# Patient Record
Sex: Male | Born: 1935 | Race: White | Hispanic: No | Smoking: Former smoker
Health system: Southern US, Community
[De-identification: ages and names within clinical notes are randomized; demographics above are authoritative.]

## PROBLEM LIST (undated history)

## (undated) DIAGNOSIS — N200 Calculus of kidney: Secondary | ICD-10-CM

## (undated) DIAGNOSIS — F1020 Alcohol dependence, uncomplicated: Secondary | ICD-10-CM

## (undated) HISTORY — PX: TONSILLECTOMY: SUR1361

## (undated) HISTORY — PX: BACK SURGERY: SHX140

## (undated) HISTORY — PX: KIDNEY STONE SURGERY: SHX686

---

## 2012-04-21 ENCOUNTER — Encounter (HOSPITAL_COMMUNITY): Payer: Self-pay

## 2012-04-21 ENCOUNTER — Emergency Department (HOSPITAL_COMMUNITY)
Admission: EM | Admit: 2012-04-21 | Discharge: 2012-04-21 | Disposition: A | Discharge status: Home / Self Care | Payer: Medicare Other | Attending: Emergency Medicine | Admitting: Emergency Medicine

## 2012-04-21 DIAGNOSIS — E119 Type 2 diabetes mellitus without complications: Secondary | ICD-10-CM | POA: Insufficient documentation

## 2012-04-21 DIAGNOSIS — F411 Generalized anxiety disorder: Secondary | ICD-10-CM | POA: Insufficient documentation

## 2012-04-21 DIAGNOSIS — R630 Anorexia: Secondary | ICD-10-CM | POA: Insufficient documentation

## 2012-04-21 DIAGNOSIS — Z87442 Personal history of urinary calculi: Secondary | ICD-10-CM | POA: Insufficient documentation

## 2012-04-21 DIAGNOSIS — F329 Major depressive disorder, single episode, unspecified: Secondary | ICD-10-CM | POA: Insufficient documentation

## 2012-04-21 DIAGNOSIS — R5381 Other malaise: Secondary | ICD-10-CM | POA: Insufficient documentation

## 2012-04-21 DIAGNOSIS — F17200 Nicotine dependence, unspecified, uncomplicated: Secondary | ICD-10-CM | POA: Insufficient documentation

## 2012-04-21 DIAGNOSIS — F3289 Other specified depressive episodes: Secondary | ICD-10-CM | POA: Insufficient documentation

## 2012-04-21 DIAGNOSIS — R5383 Other fatigue: Secondary | ICD-10-CM | POA: Insufficient documentation

## 2012-04-21 HISTORY — DX: Calculus of kidney: N20.0

## 2012-04-21 LAB — URINALYSIS, ROUTINE W REFLEX MICROSCOPIC
Leukocytes, UA: NEGATIVE
Nitrite: NEGATIVE
Protein, ur: 30 mg/dL — AB
Urobilinogen, UA: 0.2 mg/dL (ref 0.0–1.0)
pH: 5.5 (ref 5.0–8.0)

## 2012-04-21 LAB — COMPREHENSIVE METABOLIC PANEL
Albumin: 4.1 g/dL (ref 3.5–5.2)
BUN: 17 mg/dL (ref 6–23)
Chloride: 102 mEq/L (ref 96–112)
Creatinine, Ser: 0.85 mg/dL (ref 0.50–1.35)
GFR calc Af Amer: >90 mL/min (ref >90)
GFR calc non Af Amer: 78 mL/min — ABNORMAL LOW (ref >90)
Glucose, Bld: 136 mg/dL — ABNORMAL HIGH (ref 70–99)
Total Bilirubin: 0.4 mg/dL (ref 0.3–1.2)

## 2012-04-21 LAB — CBC WITH DIFFERENTIAL/PLATELET
Basophils Absolute: 0 10*3/uL (ref 0.0–0.1)
Eosinophils Relative: 3 % (ref 0–5)
HCT: 37.6 % — ABNORMAL LOW (ref 39.0–52.0)
Lymphocytes Relative: 37 % (ref 12–46)
MCHC: 33.5 g/dL (ref 30.0–36.0)
MCV: 84.1 fL (ref 78.0–100.0)
Monocytes Absolute: 0.3 10*3/uL (ref 0.1–1.0)
RDW: 13.7 % (ref 11.5–15.5)
WBC: 5.1 10*3/uL (ref 4.0–10.5)

## 2012-04-21 LAB — ETHANOL: Alcohol, Ethyl (B): <11 mg/dL (ref 0–11)

## 2012-04-21 MED ORDER — SODIUM CHLORIDE 0.9 % IV SOLN
INTRAVENOUS | Status: DC
  Administered 2012-04-21: 125 mL/h via INTRAVENOUS

## 2012-04-21 MED ORDER — ALPRAZOLAM 0.5 MG PO TABS: 0.5000 mg | ORAL_TABLET | Freq: Every evening | ORAL | Status: AC | PRN

## 2012-04-21 MED ORDER — BUPROPION HCL ER (XL) 150 MG PO TB24: 150.0000 mg | ORAL_TABLET | Freq: Every day | ORAL | Status: DC

## 2012-04-21 MED ORDER — SODIUM CHLORIDE 0.9 % IV BOLUS (SEPSIS)
500.0000 mL | Freq: Once | INTRAVENOUS | Status: AC
  Administered 2012-04-21: 500 mL via INTRAVENOUS

## 2012-04-21 MED ORDER — VITAMIN B-1 100 MG PO TABS
100.0000 mg | ORAL_TABLET | Freq: Once | ORAL | Status: AC
  Administered 2012-04-21: 100 mg via ORAL
  Filled 2012-04-21: qty 1

## 2012-04-21 NOTE — ED Provider Notes (Signed)
History     CSN: [id number]  Arrival date & time 04/21/12  1538   First MD Initiated Contact with Patient 04/21/12 1551      Chief Complaint  Patient presents with  . Weakness  . Anxiety    HPI The patient was brought in by his daughter for trouble with poor appetite, anxiety, restlessness and general weakness.  He has been having trouble with anxiety at night and poor sleep.  He will wake up with anxiety and concerns that he might be having a heart attack although he denies chest pain or shortness of breath.  These symptoms have been ongoing to some extent ever since his wife went into a nursing home home three years ago.  She recently passed away in 2023-08-25. Mr. Geiman admits he started drinking daily when his wife went into the nursing home.  Maybe 6 oz of whisky every day.  He will drink but does not have much appetite.  Since his wife went into the nursing home he has had to prepare his own foods.  He has been having forzen dinners but does not have much appetite for them anymore.  His daughter lives 15 miles away and she often tries to bring him food and meals.  His daughter convinced him to come in today to make sure there was nothing serious going on.  He denies SI, HI.   Past Medical History  Diagnosis Date  . Diabetes mellitus   . Kidney stones     Past Surgical History  Procedure Date  . Back surgery   . Kidney stone surgery   . Tonsillectomy     No family history on file.  History  Substance Use Topics  . Smoking status: Current Some Day Smoker    Types: Cigarettes, Pipe  . Smokeless tobacco: Not on file  . Alcohol Use: Yes     daily -whiskey      Review of Systems  Constitutional: Negative for fever.  Respiratory: Negative for wheezing.   Cardiovascular: Negative for chest pain.  Gastrointestinal: Negative for abdominal pain.  Skin: Negative for rash.  Neurological: Negative for tremors.  Psychiatric/Behavioral: Negative for confusion.  All other  systems reviewed and are negative.    Allergies  Review of patient's allergies indicates not on file.  Home Medications  No current outpatient prescriptions on file.  BP 147/81  Pulse 71  Temp 98.5 F (36.9 C) (Oral)  Resp 18  Ht 5\' 10"  (1.778 m)  Wt 190 lb (86.183 kg)  BMI 27.26 kg/m2  SpO2 92%  Physical Exam  Nursing note and vitals reviewed. Constitutional: He appears well-developed and well-nourished. No distress.  HENT:  Head: Normocephalic and atraumatic.  Right Ear: External ear normal.  Left Ear: External ear normal.  Eyes: Conjunctivae are normal. Right eye exhibits no discharge. Left eye exhibits no discharge. No scleral icterus.  Neck: Neck supple. No tracheal deviation present.  Cardiovascular: Normal rate, regular rhythm and intact distal pulses.   Pulmonary/Chest: Effort normal and breath sounds normal. No stridor. No respiratory distress. He has no wheezes. He has no rales.  Abdominal: Soft. Bowel sounds are normal. He exhibits no distension. There is no tenderness. There is no rebound and no guarding.  Musculoskeletal: He exhibits no edema and no tenderness.  Neurological: He is alert. He has normal strength. No sensory deficit. Cranial nerve deficit:  no gross defecits noted. He exhibits normal muscle tone. He displays no seizure activity. Coordination normal.  Skin: Skin is  warm and dry. No rash noted.  Psychiatric: He has a normal mood and affect.    ED Course  Procedures (including critical care time)  Rate: 62  Rhythm: normal sinus rhythm with first degree av block  QRS Axis: normal  Intervals: normal  ST/T Wave abnormalities: normal  Conduction Disutrbances:none  Old EKG Reviewed: none available  Labs Reviewed  CBC WITH DIFFERENTIAL - Abnormal; Notable for the following:    Hemoglobin 12.6 (*)     HCT 37.6 (*)     Platelets 138 (*)     All other components within normal limits  COMPREHENSIVE METABOLIC PANEL - Abnormal; Notable for the  following:    Glucose, Bld 136 (*)     GFR calc non Af Amer 78 (*)     All other components within normal limits  URINALYSIS, ROUTINE W REFLEX MICROSCOPIC - Abnormal; Notable for the following:    Specific Gravity, Urine >1.030 (*)     Bilirubin Urine SMALL (*)     Ketones, ur TRACE (*)     Protein, ur 30 (*)     All other components within normal limits  URINE MICROSCOPIC-ADD ON - Abnormal; Notable for the following:    Casts HYALINE CASTS (*)     All other components within normal limits  ETHANOL  URINE CULTURE   No results found.   1. Depression       MDM  I suspect the patient's symptoms are related to depression.  We discussed decreasing his alcohol consumption.  Pt had been on celexa in the past but he did not like the side effects.  Will try wellbutrin.  Will also given a short course of xanax to help with his sleeping.  Pt has family support here with his daughter.  [facility] resources provided.        Celene Kras, MD 04/21/12 [phone]

## 2012-04-21 NOTE — ED Notes (Signed)
Pt's wife passed away in oct. Has not been taking care of self well, recently (last 2 weeks) having lots of anxiety, restless, and weakness. "pacing a lot"

## 2012-04-21 NOTE — ED Notes (Signed)
Pt brought to er by daughter with concern that pt had not been taking care of himself lately, pt states that his wife passed away in 10-Aug-2011 after being in nursing home for several years, last few weeks pt has been experiencing nervousness, not sleeping well, pacing, not eating or drinking well. Pt does admit to drinking ETOH at times, Dr. Lynelle Doctor at bedside,

## 2012-04-24 LAB — URINE CULTURE
Colony Count: NO GROWTH
Culture: NO GROWTH

## 2015-01-28 ENCOUNTER — Emergency Department (HOSPITAL_COMMUNITY)
Admission: EM | Admit: 2015-01-28 | Discharge: 2015-01-28 | Disposition: A | Discharge status: Home / Self Care | Payer: Medicare Other | Attending: Emergency Medicine | Admitting: Emergency Medicine

## 2015-01-28 ENCOUNTER — Encounter (HOSPITAL_COMMUNITY): Payer: Self-pay

## 2015-01-28 DIAGNOSIS — L97929 Non-pressure chronic ulcer of unspecified part of left lower leg with unspecified severity: Secondary | ICD-10-CM

## 2015-01-28 DIAGNOSIS — I83029 Varicose veins of left lower extremity with ulcer of unspecified site: Secondary | ICD-10-CM

## 2015-01-28 DIAGNOSIS — E119 Type 2 diabetes mellitus without complications: Secondary | ICD-10-CM | POA: Diagnosis not present

## 2015-01-28 DIAGNOSIS — Z87442 Personal history of urinary calculi: Secondary | ICD-10-CM | POA: Diagnosis not present

## 2015-01-28 DIAGNOSIS — L97829 Non-pressure chronic ulcer of other part of left lower leg with unspecified severity: Secondary | ICD-10-CM | POA: Insufficient documentation

## 2015-01-28 DIAGNOSIS — Z79899 Other long term (current) drug therapy: Secondary | ICD-10-CM | POA: Diagnosis not present

## 2015-01-28 DIAGNOSIS — Z7982 Long term (current) use of aspirin: Secondary | ICD-10-CM | POA: Insufficient documentation

## 2015-01-28 DIAGNOSIS — Z87891 Personal history of nicotine dependence: Secondary | ICD-10-CM | POA: Diagnosis not present

## 2015-01-28 DIAGNOSIS — L97919 Non-pressure chronic ulcer of unspecified part of right lower leg with unspecified severity: Secondary | ICD-10-CM

## 2015-01-28 DIAGNOSIS — I83019 Varicose veins of right lower extremity with ulcer of unspecified site: Secondary | ICD-10-CM

## 2015-01-28 DIAGNOSIS — I83008 Varicose veins of unspecified lower extremity with ulcer other part of lower leg: Secondary | ICD-10-CM | POA: Diagnosis not present

## 2015-01-28 DIAGNOSIS — R2243 Localized swelling, mass and lump, lower limb, bilateral: Secondary | ICD-10-CM | POA: Diagnosis present

## 2015-01-28 MED ORDER — HYDROCODONE-ACETAMINOPHEN 5-325 MG PO TABS: 1.0000 | ORAL_TABLET | ORAL | Status: DC | PRN

## 2015-01-28 NOTE — ED Provider Notes (Signed)
CSN: [id number]     Arrival date & time 01/28/15  1537 History   First MD Initiated Contact with Patient 01/28/15 1614     Chief Complaint  Patient presents with  . Leg Swelling     (Consider location/radiation/quality/duration/timing/severity/associated sxs/prior Treatment) HPI.... Bilateral leg swelling and redness for several months. Patient's been treated by his primary care doctor with Lasix for the edema, antibiotics for cellulitis. There is now some weeping in the lower extremities. No chest pain, dyspnea, fever. Severity is moderate. Nothing makes symptoms better or worse.  Past Medical History  Diagnosis Date  . Diabetes mellitus   . Kidney stones    Past Surgical History  Procedure Laterality Date  . Back surgery    . Kidney stone surgery    . Tonsillectomy     No family history on file. History  Substance Use Topics  . Smoking status: Former Smoker    Types: Cigarettes, Pipe  . Smokeless tobacco: Not on file  . Alcohol Use: Yes     Comment: 8 shots of whiskey daily    Review of Systems  All other systems reviewed and are negative.     Allergies  Bee venom  Home Medications   Prior to Admission medications   Medication Sig Start Date End Date Taking? Authorizing Provider  aspirin EC 81 MG tablet Take 81 mg by mouth daily.   Yes Historical Provider, MD  fosinopril (MONOPRIL) 40 MG tablet Take 40 mg by mouth 2 (two) times daily.   Yes Historical Provider, MD  furosemide (LASIX) 40 MG tablet Take 40 mg by mouth daily. 01/14/15  Yes Historical Provider, MD  metFORMIN (GLUCOPHAGE) 500 MG tablet Take 500 mg by mouth 2 (two) times daily.   Yes Historical Provider, MD  ranitidine (ZANTAC) 150 MG tablet Take 150 mg by mouth 2 (two) times daily.   Yes Historical Provider, MD  simvastatin (ZOCOR) 20 MG tablet Take 10 mg by mouth at bedtime.    Yes Historical Provider, MD  terazosin (HYTRIN) 5 MG capsule Take 5 mg by mouth at bedtime.   Yes Historical Provider, MD   buPROPion (WELLBUTRIN XL) 150 MG 24 hr tablet Take 1 tablet (150 mg total) by mouth daily. Patient not taking: Reported on 01/28/2015 04/21/12 04/21/13  Linwood Dibbles, MD  diphenhydrAMINE (BENADRYL) 25 mg capsule Take 25 mg by mouth at bedtime as needed for sleep. sleep    Historical Provider, MD  hydrochlorothiazide (HYDRODIURIL) 25 MG tablet Take 25-50 mg by mouth daily. 12/30/14   Historical Provider, MD  HYDROcodone-acetaminophen (NORCO) 5-325 MG per tablet Take 1 tablet by mouth every 4 (four) hours as needed. 01/28/15   Donnetta Hutching, MD   BP 103/55 mmHg  Pulse 84  Temp(Src) 97.6 F (36.4 C) (Oral)  Resp 20  Ht  (1.778 m)  Wt 230 lb (104.327 kg)  BMI 33.00 kg/m2  SpO2 100% Physical Exam  Constitutional: He is oriented to person, place, and time. He appears well-developed and well-nourished.  HENT:  Head: Normocephalic and atraumatic.  Eyes: Conjunctivae and EOM are normal. Pupils are equal, round, and reactive to light.  Neck: Normal range of motion. Neck supple.  Cardiovascular: Normal rate and regular rhythm.   Pulmonary/Chest: Effort normal and breath sounds normal.  Abdominal: Soft. Bowel sounds are normal.  Musculoskeletal: Normal range of motion.  Bilateral posterior thighs and calf areas nontender  Neurological: He is alert and oriented to person, place, and time.  Skin:  Bilateral lower extremities  from mid tibia distally exhibit erythema and some weepiness. No frank cellulitis.  Psychiatric: He has a normal mood and affect. His behavior is normal.  Nursing note and vitals reviewed.   ED Course  Procedures (including critical care time) Labs Review Labs Reviewed - No data to display  Imaging Review No results found.   EKG Interpretation None      MDM   Final diagnoses:  Venous stasis ulcers of both lower extremities    I discussed the diagnosis of venous stasis ulcers with the patient and his daughter. He[place].  Rx Norco for pain in feet.    Donnetta HutchingBrian Lamarcus Spira, MD 01/28/15 [phone]

## 2015-01-28 NOTE — Discharge Instructions (Signed)
Stasis Ulcer  A stasis ulcer is a sore on the skin. It occurs in the legs when your blood flow is damaged.  HOME CARE  Do not stand or sit in one position for a long time. Do not sit with your legs crossed. Raise (elevate) your legs.  Wear elastic stockings (compression stockings). Do not wear tight clothing around the legs or waist area. Use and apply bandages (dressings) as told.  Walk as much as possible. If you take long car or plane rides, take a break to walk around every 2 hours.  Only take medicine as told by your doctor.  Raise the end of your bed with 2-inch blocks only if your doctor says it is okay.  If you cut the skin on your leg, lie down and raise your leg. Gently clean the area with a clean cloth. Then, put pressure on the cut until the bleeding stops. Put a bandage on.  Keep all doctor visits. GET HELP RIGHT AWAY IF:  The ulcer area starts to break down.  You have pain, redness, or tenderness in or around the ulcer.  You have yellowish-white fluid (pus) or hard puffiness (swelling) in or around the ulcer.  Your pain gets worse.  You get a fever.  You have chest pain or shortness of breath. MAKE SURE YOU:  Understand these instructions.  Will watch your condition.  Will get help right away if you are not doing well or get worse. Document Released: 11/03/2004 Document Revised: 01/21/2013 Document Reviewed: 01/25/2011 [facility] Patient Information 2015 [employer] [place]. This information is not intended to replace advice given to you by your health care provider. Make sure you discuss any questions you have with your health care provider.   You have a condition called  venous stasis ulcers. Recommend visit to wound care center. Prescription for pain medication.

## 2015-01-28 NOTE — ED Notes (Signed)
Daughter reports pt has been on lasix for the past 2 months because of swelling, redness, and weeping edema in both legs.  Reprots R worse than left.

## 2015-02-10 ENCOUNTER — Emergency Department (HOSPITAL_COMMUNITY): Payer: Medicare Other

## 2015-02-10 ENCOUNTER — Emergency Department (HOSPITAL_COMMUNITY)
Admission: EM | Admit: 2015-02-10 | Discharge: 2015-02-10 | Disposition: A | Discharge status: Home / Self Care | Payer: Medicare Other | Attending: Emergency Medicine | Admitting: Emergency Medicine

## 2015-02-10 ENCOUNTER — Encounter (HOSPITAL_COMMUNITY): Payer: Self-pay

## 2015-02-10 DIAGNOSIS — E119 Type 2 diabetes mellitus without complications: Secondary | ICD-10-CM | POA: Diagnosis not present

## 2015-02-10 DIAGNOSIS — Z87442 Personal history of urinary calculi: Secondary | ICD-10-CM | POA: Insufficient documentation

## 2015-02-10 DIAGNOSIS — Z79899 Other long term (current) drug therapy: Secondary | ICD-10-CM | POA: Insufficient documentation

## 2015-02-10 DIAGNOSIS — Z7982 Long term (current) use of aspirin: Secondary | ICD-10-CM | POA: Insufficient documentation

## 2015-02-10 DIAGNOSIS — M79642 Pain in left hand: Secondary | ICD-10-CM | POA: Diagnosis present

## 2015-02-10 DIAGNOSIS — L02512 Cutaneous abscess of left hand: Secondary | ICD-10-CM | POA: Insufficient documentation

## 2015-02-10 DIAGNOSIS — Z87891 Personal history of nicotine dependence: Secondary | ICD-10-CM | POA: Diagnosis not present

## 2015-02-10 MED ORDER — LIDOCAINE HCL (PF) 1 % IJ SOLN
5.0000 mL | Freq: Once | INTRAMUSCULAR | Status: AC
Start: 2015-02-10 — End: 2015-02-10
  Administered 2015-02-10: 5 mL
  Filled 2015-02-10: qty 5

## 2015-02-10 MED ORDER — DOXYCYCLINE HYCLATE 100 MG PO TABS
100.0000 mg | ORAL_TABLET | Freq: Once | ORAL | Status: AC
  Administered 2015-02-10: 100 mg via ORAL
  Filled 2015-02-10: qty 1

## 2015-02-10 MED ORDER — DOXYCYCLINE HYCLATE 100 MG PO TABS: 100.0000 mg | ORAL_TABLET | Freq: Two times a day (BID) | ORAL | Status: DC

## 2015-02-10 NOTE — ED Notes (Signed)
Pt reports pain in left index finger Saturday.  Noticed a bump, redness, and swelling x 3 days.  Denies injury.  [place] [facility] [place].

## 2015-02-10 NOTE — Discharge Instructions (Signed)

## 2015-02-18 NOTE — ED Provider Notes (Signed)
CSN: [id number]     Arrival date & time 02/10/15  1702 History   First MD Initiated Contact with Patient 02/10/15 1959     Chief Complaint  Patient presents with  . Hand Pain     (Consider location/radiation/quality/duration/timing/severity/associated sxs/prior Treatment) HPI   87yM with increasing pain and swelling in L index finger. Denies trauma. Onset 3 days ago. Progressively worsening. No fever or chills. No drainage. [facility] referred to ED over concern that "infection might be in the joint."  Past Medical History  Diagnosis Date  . Diabetes mellitus   . Kidney stones    Past Surgical History  Procedure Laterality Date  . Back surgery    . Kidney stone surgery    . Tonsillectomy     No family history on file. History  Substance Use Topics  . Smoking status: Former Smoker    Types: Cigarettes, Pipe  . Smokeless tobacco: Not on file  . Alcohol Use: Yes     Comment: 8 shots of whiskey daily    Review of Systems  All systems reviewed and negative, other than as noted in HPI.   Allergies  Bee venom  Home Medications   Prior to Admission medications   Medication Sig Start Date End Date Taking? Authorizing Provider  aspirin EC 81 MG tablet Take 81 mg by mouth daily.   Yes Historical Provider, MD  diphenhydrAMINE (BENADRYL) 25 mg capsule Take 25 mg by mouth at bedtime as needed for sleep. sleep   Yes Historical Provider, MD  fosinopril (MONOPRIL) 40 MG tablet Take 40 mg by mouth 2 (two) times daily.   Yes Historical Provider, MD  furosemide (LASIX) 40 MG tablet Take 40 mg by mouth daily. 01/14/15  Yes Historical Provider, MD  metFORMIN (GLUCOPHAGE) 500 MG tablet Take 500 mg by mouth 2 (two) times daily.   Yes Historical Provider, MD  ranitidine (ZANTAC) 150 MG tablet Take 150 mg by mouth 2 (two) times daily.   Yes Historical Provider, MD  simvastatin (ZOCOR) 20 MG tablet Take 10 mg by mouth at bedtime.    Yes Historical Provider, MD  terazosin  (HYTRIN) 5 MG capsule Take 5 mg by mouth at bedtime.   Yes Historical Provider, MD  buPROPion (WELLBUTRIN XL) 150 MG 24 hr tablet Take 1 tablet (150 mg total) by mouth daily. Patient not taking: Reported on 02/10/2015 04/21/12   Linwood DibblesJon Knapp, MD  doxycycline (VIBRA-TABS) 100 MG tablet Take 1 tablet (100 mg total) by mouth 2 (two) times daily. 02/10/15   Raeford RazorStephen Jaideep Pollack, MD  HYDROcodone-acetaminophen (NORCO) 5-325 MG per tablet Take 1 tablet by mouth every 4 (four) hours as needed. Patient not taking: Reported on 02/10/2015 01/28/15   Donnetta HutchingBrian Cook, MD   BP 122/66 mmHg  Pulse 72  Temp(Src) 98.1 F (36.7 C) (Oral)  Resp 16  Ht 6' (1.829 m)  Wt 220 lb (99.791 kg)  BMI 29.83 kg/m2  SpO2 99% Physical Exam  Constitutional: He appears well-developed and well-nourished. No distress.  HENT:  Head: Normocephalic and atraumatic.  Eyes: Conjunctivae are normal. Right eye exhibits no discharge. Left eye exhibits no discharge.  Neck: Neck supple.  Cardiovascular: Normal rate, regular rhythm and normal heart sounds.  Exam reveals no gallop and no friction rub.   No murmur heard. Pulmonary/Chest: Effort normal and breath sounds normal. No respiratory distress.  Abdominal: Soft. He exhibits no distension. There is no tenderness.  Musculoskeletal: He exhibits no edema or tenderness.  Neurological: He is alert.  Skin: Skin is warm and dry.  Abscess dorsal aspect L index finger over distal phalanx, but too proximal for paronychia. Able to flex at DIP easily.   Psychiatric: He has a normal mood and affect. His behavior is normal. Thought content normal.  Nursing note and vitals reviewed.   ED Course  Procedures (including critical care time)  INCISION AND DRAINAGE Performed by: Raeford RazorKOHUT, Haileigh Pitz Consent: Verbal consent obtained. Risks and benefits: risks, benefits and alternatives were discussed Type: abscess  Body area: L index finger  Anesthesia: local infiltration  Incision was made with a  scalpel.  Local anesthetic: lidocaine 1% w/o epinephrine  Anesthetic total: 1.5 ml  Complexity: complex Blunt dissection to break up loculations  Drainage: purulent  Drainage amount: small-moderate  Packing material: none Patient tolerance: Patient tolerated the procedure well with no immediate complications.    Labs Review Labs Reviewed - No data to display  Imaging Review No results found.   EKG Interpretation None      MDM   Final diagnoses:  Abscess of finger, left    87yM with finger abscess. I&D. Wound care and return precautions discussed.     Raeford RazorStephen Tamar Miano, MD 02/18/15 [phone]

## 2015-10-26 ENCOUNTER — Encounter (HOSPITAL_COMMUNITY): Payer: Self-pay | Admitting: Emergency Medicine

## 2015-10-26 ENCOUNTER — Emergency Department (HOSPITAL_COMMUNITY)
Admission: EM | Admit: 2015-10-26 | Discharge: 2015-10-26 | Disposition: A | Discharge status: Home / Self Care | Payer: Medicare Other | Attending: Emergency Medicine | Admitting: Emergency Medicine

## 2015-10-26 ENCOUNTER — Emergency Department (HOSPITAL_COMMUNITY): Payer: Medicare Other

## 2015-10-26 DIAGNOSIS — Z87891 Personal history of nicotine dependence: Secondary | ICD-10-CM | POA: Diagnosis not present

## 2015-10-26 DIAGNOSIS — Z87442 Personal history of urinary calculi: Secondary | ICD-10-CM | POA: Diagnosis not present

## 2015-10-26 DIAGNOSIS — Z792 Long term (current) use of antibiotics: Secondary | ICD-10-CM | POA: Insufficient documentation

## 2015-10-26 DIAGNOSIS — N289 Disorder of kidney and ureter, unspecified: Secondary | ICD-10-CM | POA: Insufficient documentation

## 2015-10-26 DIAGNOSIS — Z79899 Other long term (current) drug therapy: Secondary | ICD-10-CM | POA: Diagnosis not present

## 2015-10-26 DIAGNOSIS — D649 Anemia, unspecified: Secondary | ICD-10-CM | POA: Diagnosis not present

## 2015-10-26 DIAGNOSIS — Z7984 Long term (current) use of oral hypoglycemic drugs: Secondary | ICD-10-CM | POA: Insufficient documentation

## 2015-10-26 DIAGNOSIS — R531 Weakness: Secondary | ICD-10-CM | POA: Diagnosis present

## 2015-10-26 DIAGNOSIS — W01198A Fall on same level from slipping, tripping and stumbling with subsequent striking against other object, initial encounter: Secondary | ICD-10-CM | POA: Insufficient documentation

## 2015-10-26 DIAGNOSIS — Z7982 Long term (current) use of aspirin: Secondary | ICD-10-CM | POA: Insufficient documentation

## 2015-10-26 DIAGNOSIS — Y92090 Kitchen in other non-institutional residence as the place of occurrence of the external cause: Secondary | ICD-10-CM | POA: Insufficient documentation

## 2015-10-26 DIAGNOSIS — S0990XA Unspecified injury of head, initial encounter: Secondary | ICD-10-CM | POA: Insufficient documentation

## 2015-10-26 DIAGNOSIS — S199XXA Unspecified injury of neck, initial encounter: Secondary | ICD-10-CM | POA: Diagnosis not present

## 2015-10-26 DIAGNOSIS — E119 Type 2 diabetes mellitus without complications: Secondary | ICD-10-CM | POA: Diagnosis not present

## 2015-10-26 DIAGNOSIS — Y998 Other external cause status: Secondary | ICD-10-CM | POA: Insufficient documentation

## 2015-10-26 DIAGNOSIS — Y9389 Activity, other specified: Secondary | ICD-10-CM | POA: Insufficient documentation

## 2015-10-26 DIAGNOSIS — R296 Repeated falls: Secondary | ICD-10-CM | POA: Diagnosis not present

## 2015-10-26 HISTORY — DX: Alcohol dependence, uncomplicated: F10.20

## 2015-10-26 LAB — BASIC METABOLIC PANEL
Anion gap: 13 (ref 5–15)
BUN: 53 mg/dL — ABNORMAL HIGH (ref 6–20)
CALCIUM: 8.7 mg/dL — AB (ref 8.9–10.3)
CHLORIDE: 102 mmol/L (ref 101–111)
CO2: 26 mmol/L (ref 22–32)
CREATININE: 1.84 mg/dL — AB (ref 0.61–1.24)
GFR calc non Af Amer: 32 mL/min — ABNORMAL LOW (ref >60)
GFR, EST AFRICAN AMERICAN: 37 mL/min — AB (ref >60)
Glucose, Bld: 140 mg/dL — ABNORMAL HIGH (ref 65–99)
Potassium: 3.7 mmol/L (ref 3.5–5.1)
SODIUM: 141 mmol/L (ref 135–145)

## 2015-10-26 LAB — CBC
HCT: 24.9 % — ABNORMAL LOW (ref 39.0–52.0)
Hemoglobin: 8.5 g/dL — ABNORMAL LOW (ref 13.0–17.0)
MCH: 30 pg (ref 26.0–34.0)
MCHC: 34.1 g/dL (ref 30.0–36.0)
MCV: 88 fL (ref 78.0–100.0)
PLATELETS: 214 10*3/uL (ref 150–400)
RBC: 2.83 MIL/uL — AB (ref 4.22–5.81)
RDW: 14.9 % (ref 11.5–15.5)
WBC: 9.2 10*3/uL (ref 4.0–10.5)

## 2015-10-26 LAB — URINALYSIS, ROUTINE W REFLEX MICROSCOPIC
BILIRUBIN URINE: NEGATIVE
Glucose, UA: NEGATIVE mg/dL
Hgb urine dipstick: NEGATIVE
KETONES UR: NEGATIVE mg/dL
LEUKOCYTES UA: NEGATIVE
NITRITE: NEGATIVE
Protein, ur: NEGATIVE mg/dL
Specific Gravity, Urine: 1.015 (ref 1.005–1.030)
pH: 5.5 (ref 5.0–8.0)

## 2015-10-26 LAB — TROPONIN I: TROPONIN I: 0.03 ng/mL (ref <0.031)

## 2015-10-26 LAB — HEPATIC FUNCTION PANEL
ALT: 26 U/L (ref 17–63)
AST: 36 U/L (ref 15–41)
Albumin: 2.8 g/dL — ABNORMAL LOW (ref 3.5–5.0)
Alkaline Phosphatase: 112 U/L (ref 38–126)
BILIRUBIN DIRECT: 0.2 mg/dL (ref 0.1–0.5)
BILIRUBIN INDIRECT: 0.6 mg/dL (ref 0.3–0.9)
Total Bilirubin: 0.8 mg/dL (ref 0.3–1.2)
Total Protein: 6.2 g/dL — ABNORMAL LOW (ref 6.5–8.1)

## 2015-10-26 LAB — CK: Total CK: 32 U/L — ABNORMAL LOW (ref 49–397)

## 2015-10-26 LAB — TYPE AND SCREEN
ABO/RH(D): A NEG
ANTIBODY SCREEN: NEGATIVE

## 2015-10-26 MED ORDER — SODIUM CHLORIDE 0.9 % IV SOLN
Freq: Once | INTRAVENOUS | Status: AC
  Administered 2015-10-26: 19:00:00 via INTRAVENOUS

## 2015-10-26 NOTE — ED Notes (Addendum)
PT and daughter states pt lost his balance and fell backwards hitting his head on the countertop in the kitchen x12 days ago. PT has been complaining of headache and neck pain since fall and daughter states increased difficulty bearing weight on his legs and increased generalized weakness. Skin tear noted to left upper arm.

## 2015-10-26 NOTE — Discharge Instructions (Signed)
Your blood tests today showed low hemoglobin and poor kidney function. It is not clear how long these conditions haven't present. Please see your primary care provider this week since he would have more recent blood work to compare to today's blood tests. In the meantime, you should not drink any alcohol.  Fall Prevention in the Home  Falls can cause injuries and can affect people from all age groups. There are many simple things that you can do to make your home safe and to help prevent falls. WHAT CAN I DO ON THE OUTSIDE OF MY HOME?  Regularly repair the edges of walkways and driveways and fix any cracks.  Remove high doorway thresholds.  Trim any shrubbery on the main path into your home.  Use bright outdoor lighting.  Clear walkways of debris and clutter, including tools and rocks.  Regularly check that handrails are securely fastened and in good repair. Both sides of any steps should have handrails.  Install guardrails along the edges of any raised decks or porches.  Have leaves, snow, and ice cleared regularly.  Use sand or salt on walkways during winter months.  In the garage, clean up any spills right away, including grease or oil spills. WHAT CAN I DO IN THE BATHROOM?  Use night lights.  Install grab bars by the toilet and in the tub and shower. Do not use towel bars as grab bars.  Use non-skid mats or decals on the floor of the tub or shower.  If you need to sit down while you are in the shower, use a plastic, non-slip stool.[place]  Keep the floor dry. Immediately clean up any water that spills on the floor.  Remove soap buildup in the tub or shower on a regular basis.  Attach bath mats securely with double-sided non-slip rug tape.  Remove throw rugs and other tripping hazards from the floor. WHAT CAN I DO IN THE BEDROOM?  Use night lights.  Make sure that a bedside light is easy to reach.  Do not use oversized bedding that drapes onto the floor.  Have a firm  chair that has side arms to use for getting dressed.  Remove throw rugs and other tripping hazards from the floor. WHAT CAN I DO IN THE KITCHEN?   Clean up any spills right away.  Avoid walking on wet floors.  Place frequently used items in easy-to-reach places.  If you need to reach for something above you, use a sturdy step stool that has a grab bar.  Keep electrical cables out of the way.  Do not use floor polish or wax that makes floors slippery. If you have to use wax, make sure that it is non-skid floor wax.  Remove throw rugs and other tripping hazards from the floor. WHAT CAN I DO IN THE STAIRWAYS?  Do not leave any items on the stairs.  Make sure that there are handrails on both sides of the stairs. Fix handrails that are broken or loose. Make sure that handrails are as long as the stairways.  Check any carpeting to make sure that it is firmly attached to the stairs. Fix any carpet that is loose or worn.  Avoid having throw rugs at the top or bottom of stairways, or secure the rugs with carpet tape to prevent them from moving.  Make sure that you have a light switch at the top of the stairs and the bottom of the stairs. If you do not have them, have them installed. WHAT  ARE SOME OTHER FALL PREVENTION TIPS?  Wear closed-toe shoes that fit well and support your feet. Wear shoes that have rubber soles or low heels.  When you use a stepladder, make sure that it is completely opened and that the sides are firmly locked. Have someone hold the ladder while you are using it. Do not climb a closed stepladder.  Add color or contrast paint or tape to grab bars and handrails in your home. Place contrasting color strips on the first and last steps.  Use mobility aids as needed, such as canes, walkers, scooters, and crutches.  Turn on lights if it is dark. Replace any light bulbs that burn out.  Set up furniture so that there are clear paths. Keep the furniture in the same  spot.  Fix any uneven floor surfaces.  Choose a carpet design that does not hide the edge of steps of a stairway.  Be aware of any and all pets.  Review your medicines with your healthcare provider. Some medicines can cause dizziness or changes in blood pressure, which increase your risk of falling. Talk with your health care provider about other ways that you can decrease your risk of falls. This may include working with a physical therapist or trainer to improve your strength, balance, and endurance.   This information is not intended to replace advice given to you by your health care provider. Make sure you discuss any questions you have with your health care provider.   Document Released: 09/16/2002 Document Revised: 02/10/2015 Document Reviewed: 10/31/2014 Elsevier Interactive Patient Education 2016 [employer].  Anemia, Nonspecific Anemia is a condition in which the concentration of red blood cells or hemoglobin in the blood is below normal. Hemoglobin is a substance in red blood cells that carries oxygen to the tissues of the body. Anemia results in not enough oxygen reaching these tissues.  CAUSES  Common causes of anemia include:   Excessive bleeding. Bleeding may be internal or external. This includes excessive bleeding from periods (in women) or from the intestine.   Poor nutrition.   Chronic kidney, thyroid, and liver disease.  Bone marrow disorders that decrease red blood cell production.  Cancer and treatments for cancer.  HIV, AIDS, and their treatments.  Spleen problems that increase red blood cell destruction.  Blood disorders.  Excess destruction of red blood cells due to infection, medicines, and autoimmune disorders. SIGNS AND SYMPTOMS   Minor weakness.   Dizziness.   Headache.  Palpitations.   Shortness of breath, especially with exercise.   Paleness.  Cold sensitivity.  Indigestion.  Nausea.  Difficulty sleeping.  Difficulty  concentrating. Symptoms may occur suddenly or they may develop slowly.  DIAGNOSIS  Additional blood tests are often needed. These help your health care provider determine the best treatment. Your health care provider will check your stool for blood and look for other causes of blood loss.  TREATMENT  Treatment varies depending on the cause of the anemia. Treatment can include:   Supplements of iron, vitamin B12, or folic acid.   Hormone medicines.   A blood transfusion. This may be needed if blood loss is severe.   Hospitalization. This may be needed if there is significant continual blood loss.   Dietary changes.  Spleen removal. HOME CARE INSTRUCTIONS Keep all follow-up appointments. It often takes many weeks to correct anemia, and having your health care provider check on your condition and your response to treatment is very important. SEEK IMMEDIATE MEDICAL CARE IF:  You develop extreme weakness, shortness of breath, or chest pain.   You become dizzy or have trouble concentrating.  You develop heavy vaginal bleeding.   You develop a rash.   You have bloody or black, tarry stools.   You faint.   You vomit up blood.   You vomit repeatedly.   You have abdominal pain.  You have a fever or persistent symptoms for more than 2-3 days.   You have a fever and your symptoms suddenly get worse.   You are dehydrated.  MAKE SURE YOU:  Understand these instructions.  Will watch your condition.  Will get help right away if you are not doing well or get worse.   This information is not intended to replace advice given to you by your health care provider. Make sure you discuss any questions you have with your health care provider.   Document Released: 11/03/2004 Document Revised: 05/29/2013 Document Reviewed: 03/22/2013 Elsevier Interactive Patient Education 2016 Elsevier Inc.  Chronic Kidney Disease Chronic kidney disease occurs when the kidneys are  damaged over a long period. The kidneys are two organs that lie on either side of the spine between the middle of the back and the front of the abdomen. The kidneys:  Remove wastes and extra water from the blood.  Produce important hormones. These help keep bones strong, regulate blood pressure, and help create red blood cells.  Balance the fluids and chemicals in the blood and tissues. A small amount of kidney damage may not cause problems, but a large amount of damage may make it difficult or impossible for the kidneys to work the way they should. If steps are not taken to slow down the kidney damage or stop it from getting worse, the kidneys may stop working permanently. Most of the time, chronic kidney disease does not go away. However, it can often be controlled, and those with the disease can usually live normal lives. CAUSES The most common causes of chronic kidney disease are diabetes and high blood pressure (hypertension). Chronic kidney disease may also be caused by:  Diseases that cause the kidneys' filters to become inflamed.  Diseases that affect the immune system.  Genetic diseases.  Medicines that damage the kidneys, such as anti-inflammatory medicines.  Poisoning or exposure to toxic substances.  A reoccurring kidney or urinary infection.  A problem with urine flow. This may be caused by:  Cancer.  Kidney stones.  An enlarged prostate in males. SIGNS AND SYMPTOMS Because the kidney damage in chronic kidney disease occurs slowly, symptoms develop slowly and may not be obvious until the kidney damage becomes severe. A person may have a kidney disease for years without showing any symptoms. Symptoms can include:  Swelling (edema) of the legs, ankles, or feet.  Tiredness (lethargy).  Nausea or vomiting.  Confusion.  Problems with urination, such as:  Decreased urine production.  Frequent urination, especially at night.  Frequent accidents in children who are  potty trained.  Muscle twitches and cramps.  Shortness of breath.  Weakness.  Persistent itchiness.  Loss of appetite.  Metallic taste in the mouth.  Trouble sleeping.  Slowed development in children.  Short stature in children. DIAGNOSIS Chronic kidney disease may be detected and diagnosed by tests, including blood, urine, imaging, or kidney biopsy tests. TREATMENT Most chronic kidney diseases cannot be cured. Treatment usually involves relieving symptoms and preventing or slowing the progression of the disease. Treatment may include:  A special diet. You may need to avoid alcohol  and foods thatare salty and high in potassium.  Medicines. These may:  Lower blood pressure.  Relieve anemia.  Relieve swelling.  Protect the bones. HOME CARE INSTRUCTIONS  Follow your prescribed diet. Your health care provider may instruct you to limit daily salt (sodium) and protein intake.  Take medicines only as directed by your health care provider. Do not take any new medicines (prescription, over-the-counter, or nutritional supplements) unless approved by your health care provider. Many medicines can worsen your kidney damage or need to have the dose adjusted.   Quit smoking if you smoke. Talk to your health care provider about a smoking cessation program.  Keep all follow-up visits as directed by your health care provider.  Monitor your blood pressure.  Start or continue an exercise plan.  Get immunizations as directed by your health care provider.  Take vitamin and mineral supplements as directed by your health care provider. SEEK IMMEDIATE MEDICAL CARE IF:  Your symptoms get worse or you develop new symptoms.  You develop symptoms of end-stage kidney disease. These include:  Headaches.  Abnormally dark or light skin.  Numbness in the hands or feet.  Easy bruising.  Frequent hiccups.  Menstruation stops.  You have a fever.  You have decreased urine  production.  You havepain or bleeding when urinating. MAKE SURE YOU:  Understand these instructions.  Will watch your condition.  Will get help right away if you are not doing well or get worse. FOR MORE INFORMATION   American Association of Kidney Patients: ResidentialShow.iswww.aakp.org  National Kidney Foundation: www.kidney.org  American Kidney Fund: FightingMatch.com.eewww.akfinc.org  [facility]: www.lifeoptions.org and www.kidneyschool.org   This information is not intended to replace advice given to you by your health care provider. Make sure you discuss any questions you have with your health care provider.   Document Released: 07/05/2008 Document Revised: 10/17/2014 Document Reviewed: 05/25/2012 Elsevier Interactive Patient Education [employer] Elsevier Inc.

## 2015-10-26 NOTE — ED Provider Notes (Signed)
CSN: [id number]     Arrival date & time 10/26/15  1509 History   First MD Initiated Contact with Patient 10/26/15 1819     Chief Complaint  Patient presents with  . Weakness     (Consider location/radiation/quality/duration/timing/severity/associated sxs/prior Treatment) Patient is a 82 y.o. male presenting with weakness. The history is provided by the patient.  Weakness  He has been having progressive weakness over the last several months and that multiple falls at home. About 2 weeks ago, he fell and hit his head on a countertop. Since then, he has complained of pain in his head and neck. He is able to ambulate within the house with a walker but is not able to go outside the home.he has chronic edema which is treated with furosemide. He also said heavy alcohol drinker admitting to 5-8 drinks a day. Daughter relates that stools have been black although patient was not aware of any change in his stool. He is not on any NSAIDs. He is on a statin. He is not in any anticoagulants or antiplatelet agents.  Past Medical History  Diagnosis Date  . Diabetes mellitus   . Kidney stones   . Alcoholism [facility])    Past Surgical History  Procedure Laterality Date  . Back surgery    . Kidney stone surgery    . Tonsillectomy     History reviewed. No pertinent family history. Social History  Substance Use Topics  . Smoking status: Former Smoker    Types: Cigarettes, Pipe  . Smokeless tobacco: None  . Alcohol Use: Yes     Comment: 8 shots of whiskey daily    Review of Systems  Neurological: Positive for weakness.  All other systems reviewed and are negative.     Allergies  Bee venom  Home Medications   Prior to Admission medications   Medication Sig Start Date End Date Taking? Authorizing Provider  aspirin EC 81 MG tablet Take 81 mg by mouth daily.    Historical Provider, MD  buPROPion (WELLBUTRIN XL) 150 MG 24 hr tablet Take 1 tablet (150 mg total) by mouth daily. Patient not  taking: Reported on 02/10/2015 04/21/12   Linwood Dibbles, MD  diphenhydrAMINE (BENADRYL) 25 mg capsule Take 25 mg by mouth at bedtime as needed for sleep. sleep    Historical Provider, MD  doxycycline (VIBRA-TABS) 100 MG tablet Take 1 tablet (100 mg total) by mouth 2 (two) times daily. 02/10/15   Raeford Razor, MD  fosinopril (MONOPRIL) 40 MG tablet Take 40 mg by mouth 2 (two) times daily.    Historical Provider, MD  furosemide (LASIX) 40 MG tablet Take 40 mg by mouth daily. 01/14/15   Historical Provider, MD  HYDROcodone-acetaminophen (NORCO) 5-325 MG per tablet Take 1 tablet by mouth every 4 (four) hours as needed. Patient not taking: Reported on 02/10/2015 01/28/15   Donnetta Hutching, MD  metFORMIN (GLUCOPHAGE) 500 MG tablet Take 500 mg by mouth 2 (two) times daily.    Historical Provider, MD  ranitidine (ZANTAC) 150 MG tablet Take 150 mg by mouth 2 (two) times daily.    Historical Provider, MD  simvastatin (ZOCOR) 20 MG tablet Take 10 mg by mouth at bedtime.     Historical Provider, MD  terazosin (HYTRIN) 5 MG capsule Take 5 mg by mouth at bedtime.    Historical Provider, MD   BP 150/86 mmHg  Pulse 69  Temp(Src) 97.9 F (36.6 C) (Oral)  Resp 18  Ht 5\' 10"  (1.778 m)  Wt 220  lb (99.791 kg)  BMI 31.57 kg/m2  SpO2 95% Physical Exam  Nursing note and vitals reviewed.  82 year old male, resting comfortably and in no acute distress. Vital signs are significant for hypertension. Oxygen saturation is 95%, which is normal. Head is normocephalic and atraumatic. PERRLA, EOMI. Oropharynx is clear.there is no conjunctival pallor. Neck is nontender and supple without adenopathy or JVD. Back is nontender and there is no CVA tenderness. Lungs are clear without rales, wheezes, or rhonchi. Chest is nontender. Heart has regular rate and rhythm without murmur. Abdomen is soft, flat, nontender without masses or hepatosplenomegaly and peristalsis is normoactive. Rectal: Decreased sphincter tone. Stool is brown and Hemoccult  negative. Extremities have 2-3+ brawny edema, full range of motion is present. Skin is warm and dry without rash. Neurologic: Mental status is normal, cranial nerves are intact, there are no motor or sensory deficits.  ED Course  Procedures (including critical care time) Labs Review Results for orders placed or performed during the hospital encounter of 10/26/15  Basic metabolic panel  Result Value Ref Range   Sodium 141 135 - 145 mmol/L   Potassium 3.7 3.5 - 5.1 mmol/L   Chloride 102 101 - 111 mmol/L   CO2 26 22 - 32 mmol/L   Glucose, Bld 140 (H) 65 - 99 mg/dL   BUN 53 (H) 6 - 20 mg/dL   Creatinine, Ser 1.611.84 (H) 0.61 - 1.24 mg/dL   Calcium 8.7 (L) 8.9 - 10.3 mg/dL   GFR calc non Af Amer 32 (L) >60 mL/min   GFR calc Af Amer 37 (L) >60 mL/min   Anion gap 13 5 - 15  CBC  Result Value Ref Range   WBC 9.2 4.0 - 10.5 K/uL   RBC 2.83 (L) 4.22 - 5.81 MIL/uL   Hemoglobin 8.5 (L) 13.0 - 17.0 g/dL   HCT 09.624.9 (L) 04.539.0 - 40.952.0 %   MCV 88.0 78.0 - 100.0 fL   MCH 30.0 26.0 - 34.0 pg   MCHC 34.1 30.0 - 36.0 g/dL   RDW 81.114.9 91.411.5 - 78.215.5 %   Platelets 214 150 - 400 K/uL  Urinalysis, Routine w reflex microscopic (not [facility])  Result Value Ref Range   Color, Urine YELLOW YELLOW   APPearance CLEAR CLEAR   Specific Gravity, Urine 1.015 1.005 - 1.030   pH 5.5 5.0 - 8.0   Glucose, UA NEGATIVE NEGATIVE mg/dL   Hgb urine dipstick NEGATIVE NEGATIVE   Bilirubin Urine NEGATIVE NEGATIVE   Ketones, ur NEGATIVE NEGATIVE mg/dL   Protein, ur NEGATIVE NEGATIVE mg/dL   Nitrite NEGATIVE NEGATIVE   Leukocytes, UA NEGATIVE NEGATIVE  Troponin I  Result Value Ref Range   Troponin I 0.03 <0.031 ng/mL  Hepatic function panel  Result Value Ref Range   Total Protein 6.2 (L) 6.5 - 8.1 g/dL   Albumin 2.8 (L) 3.5 - 5.0 g/dL   AST 36 15 - 41 U/L   ALT 26 17 - 63 U/L   Alkaline Phosphatase 112 38 - 126 U/L   Total Bilirubin 0.8 0.3 - 1.2 mg/dL   Bilirubin, Direct 0.2 0.1 - 0.5 mg/dL   Indirect Bilirubin 0.6  0.3 - 0.9 mg/dL  CK  Result Value Ref Range   Total CK 32 (L) 49 - 397 U/L  Type and screen [facility]  Result Value Ref Range   ABO/RH(D) A NEG    Antibody Screen NEG    Sample Expiration 10/29/2015    Imaging Review Ct Head  Wo Contrast  10/26/2015  CLINICAL DATA:  Lost his balance and fell backwards striking head against countertop in kitchen 12 days ago, complaining of headache and neck pain since fall, increased difficulty bearing weight, increase generalized weakness, history diabetes mellitus, former smoker, alcoholism EXAM: CT HEAD WITHOUT CONTRAST CT CERVICAL SPINE WITHOUT CONTRAST TECHNIQUE: Multidetector CT imaging of the head and cervical spine was performed following the standard protocol without intravenous contrast. Multiplanar CT image reconstructions of the cervical spine were also generated. COMPARISON:  None FINDINGS: CT HEAD FINDINGS Generalized atrophy. Normal ventricular morphology. No midline shift or mass effect. Small vessel chronic ischemic changes of deep cerebral white matter. No intracranial hemorrhage, mass lesion, or acute infarction. Visualized paranasal sinuses and mastoid air cells clear. Bones demineralized. Atherosclerotic calcification of internal carotid arteries at skullbase. CT CERVICAL SPINE FINDINGS Diffuse osseous demineralization. Prevertebral soft tissues normal thickness. Multilevel facet degenerative changes. Diffuse disc space narrowing and endplate spur formation cervical spine. Vertebral body heights maintained without fracture or subluxation. Visualized skullbase intact. Lung apices clear. Lateral cervical flexion to the RIGHT. IMPRESSION: Atrophy with small vessel chronic ischemic changes of deep cerebral white matter. No acute intracranial abnormalities. Osseous demineralization with multilevel degenerative disc and facet disease changes of the cervical spine. No acute cervical spine abnormalities. Electronically Signed   By: Ulyses Southward M.D.    On: 10/26/2015 16:19   Ct Cervical Spine Wo Contrast  10/26/2015  CLINICAL DATA:  Lost his balance and fell backwards striking head against countertop in kitchen 12 days ago, complaining of headache and neck pain since fall, increased difficulty bearing weight, increase generalized weakness, history diabetes mellitus, former smoker, alcoholism EXAM: CT HEAD WITHOUT CONTRAST CT CERVICAL SPINE WITHOUT CONTRAST TECHNIQUE: Multidetector CT imaging of the head and cervical spine was performed following the standard protocol without intravenous contrast. Multiplanar CT image reconstructions of the cervical spine were also generated. COMPARISON:  None FINDINGS: CT HEAD FINDINGS Generalized atrophy. Normal ventricular morphology. No midline shift or mass effect. Small vessel chronic ischemic changes of deep cerebral white matter. No intracranial hemorrhage, mass lesion, or acute infarction. Visualized paranasal sinuses and mastoid air cells clear. Bones demineralized. Atherosclerotic calcification of internal carotid arteries at skullbase. CT CERVICAL SPINE FINDINGS Diffuse osseous demineralization. Prevertebral soft tissues normal thickness. Multilevel facet degenerative changes. Diffuse disc space narrowing and endplate spur formation cervical spine. Vertebral body heights maintained without fracture or subluxation. Visualized skullbase intact. Lung apices clear. Lateral cervical flexion to the RIGHT. IMPRESSION: Atrophy with small vessel chronic ischemic changes of deep cerebral white matter. No acute intracranial abnormalities. Osseous demineralization with multilevel degenerative disc and facet disease changes of the cervical spine. No acute cervical spine abnormalities. Electronically Signed   By: Ulyses Southward M.D.   On: 10/26/2015 16:19   I have personally reviewed and evaluated these images and lab results as part of my medical decision-making.   EKG Interpretation   Date/Time:  Monday October 26 2015  18:36:10 EST Ventricular Rate:  62 PR Interval:  190 QRS Duration: 104 QT Interval:  441 QTC Calculation: 448 R Axis:   52 Text Interpretation:  Sinus rhythm Atrial premature complex Borderline low  voltage, extremity leads Low voltage QRS When compared with ECG of  04/21/2012, No significant change was found Confirmed by [facility]  MD, Michio Thier  ([id number]) on 10/26/2015 6:41:07 PM      MDM   Final diagnoses:  Frequent falls  Normochromic normocytic anemia  Renal insufficiency    Progressive weakness with falls. CT  of head and cervical spine showed no acute injury.laboratory workup is significant for hemoglobin of 8.5 and creatinine 1.84 with BUN 53. When last checked in our system (Juuly 13, 2013), hemoglobin was 12.6 and creatinine 0.85 with BUN of 17. Unfortunately, with no ability to check his records from his physician's office, I cannot tell if the drop in hemoglobin and rise in creatinine are new or old. Because he is on a statin, CK will be checked. Over, it seems that he has reached the point where he is not able to live at home safely.  Orthostatic vital signs showed no significant change in blood pressure or heart rate indicating that he is not acutely dehydrated and has not had significant recent blood loss. At this point, I do not see an indication for any acute care hospitalization. I discussed this with patient and his family. He is referred back to his PCP who has more recent lab work to with which to compare. I have discussed with the patient the health dangers of frequent falls and urged him to be as careful as possible. I have also broached the possibility of needing to live at a facility where he can get additional assistance.  Dione Booze, MD 10/26/15 2125

## 2017-06-10 DEATH — deceased

## 2017-09-05 IMAGING — CT CT HEAD W/O CM
4 of 5 series · 15 of 47 positions shown, 16 images · non-contrast
Comparison: None

CLINICAL DATA: Lost his balance and fell backwards striking head
against countertop in kitchen 12 days ago, complaining of headache
and neck pain since fall, increased difficulty bearing weight,
increase generalized weakness, history diabetes mellitus, former
smoker, alcoholism

EXAM:
CT HEAD WITHOUT CONTRAST
CT CERVICAL SPINE WITHOUT CONTRAST
TECHNIQUE: Multidetector CT imaging of the head and cervical spine was
performed following the standard protocol without intravenous
contrast. Multiplanar CT image reconstructions of the cervical spine
were also generated.

[Series 2: headseq 4.8 h37s · axial · 0.47mm/px · z∈[+267,+357]mm · 3 of 36 slices shown, 4 images]
[im 9/36  brain]
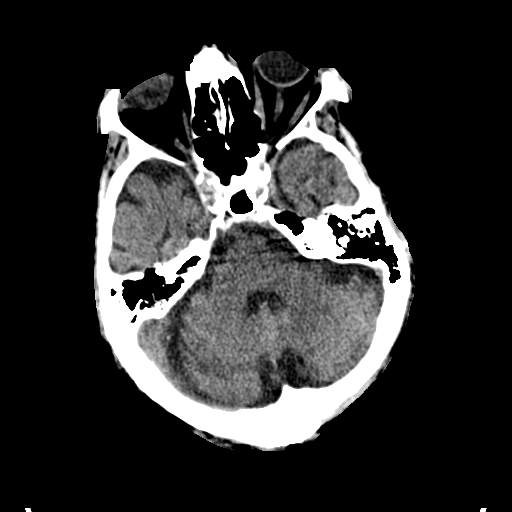
[im 9/36  bone]
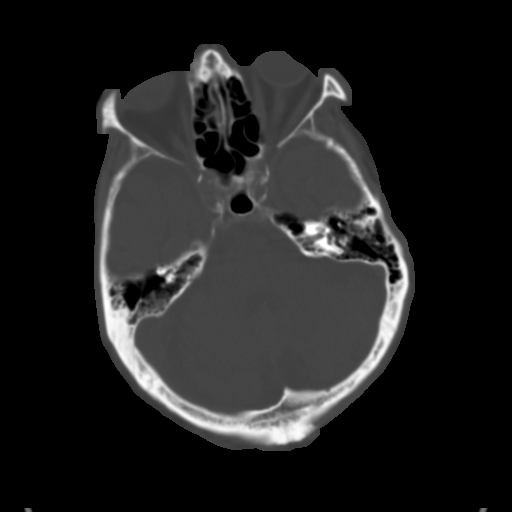
[im 18/36  brain]
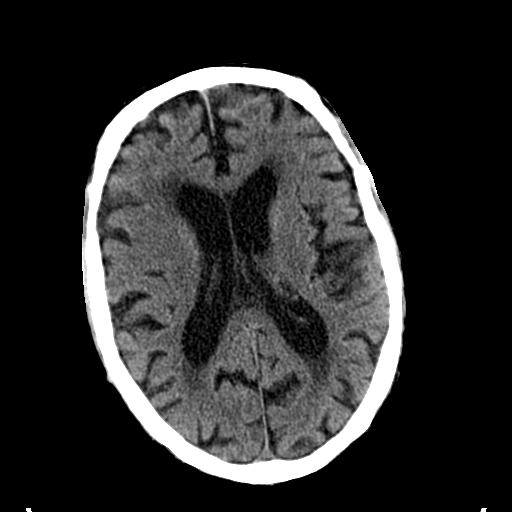
[im 27/36  brain]
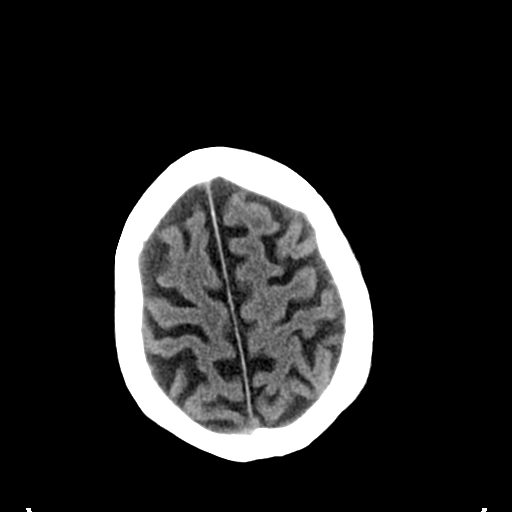

[Series 7: sagittal bone 2.0 · sagittal · 0.25mm/px · 3 of 53 slices shown]
[im 18/53  brain]
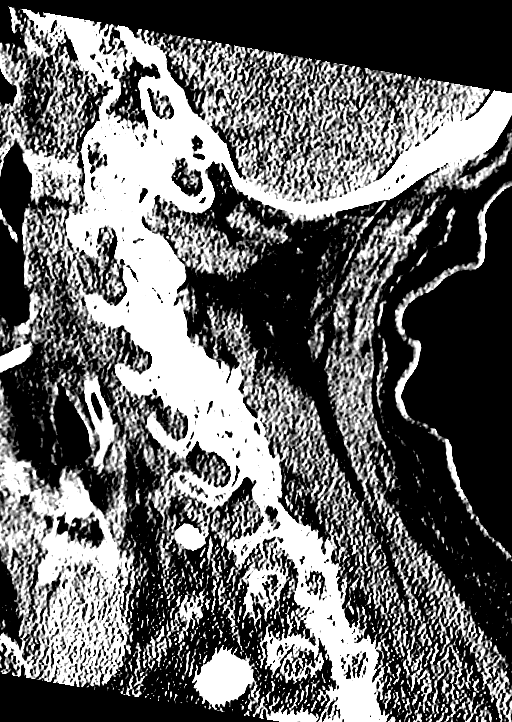
[im 27/53  brain]
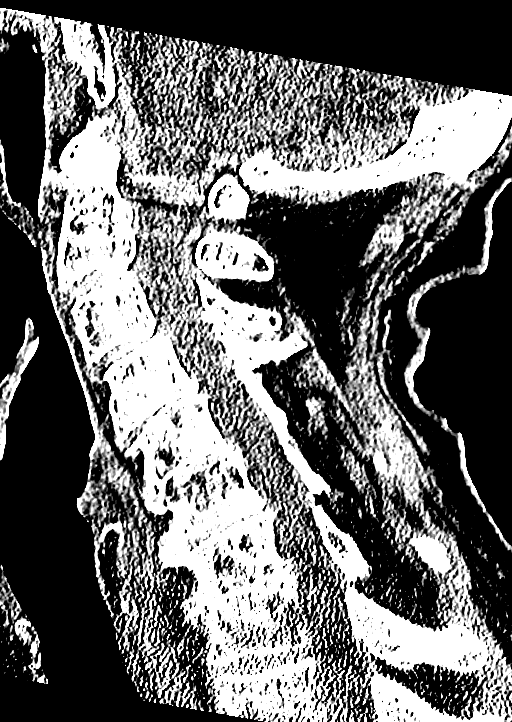
[im 35/53  brain]
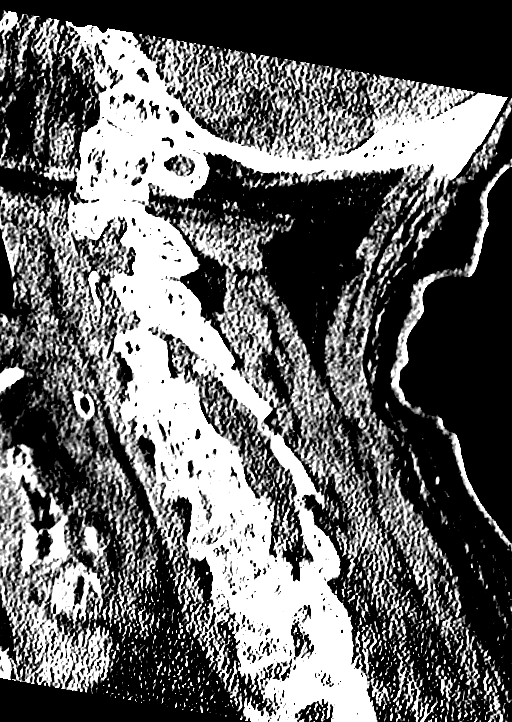

[Series 8: coronal bone 2.0 · coronal · 0.24mm/px · 3 of 53 slices shown]
[im 18/53  brain]
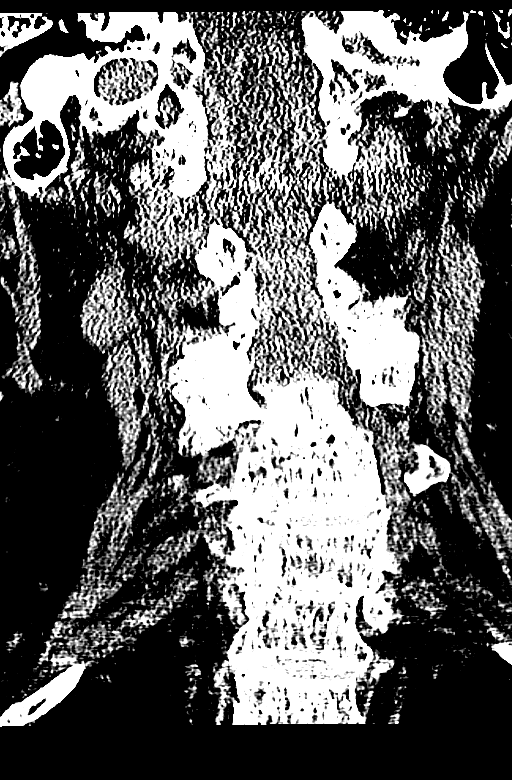
[im 24/53  brain]
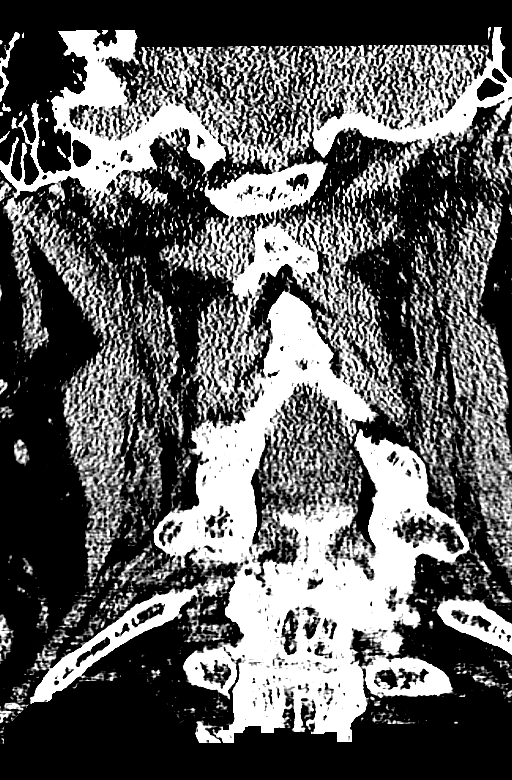
[im 29/53  brain]
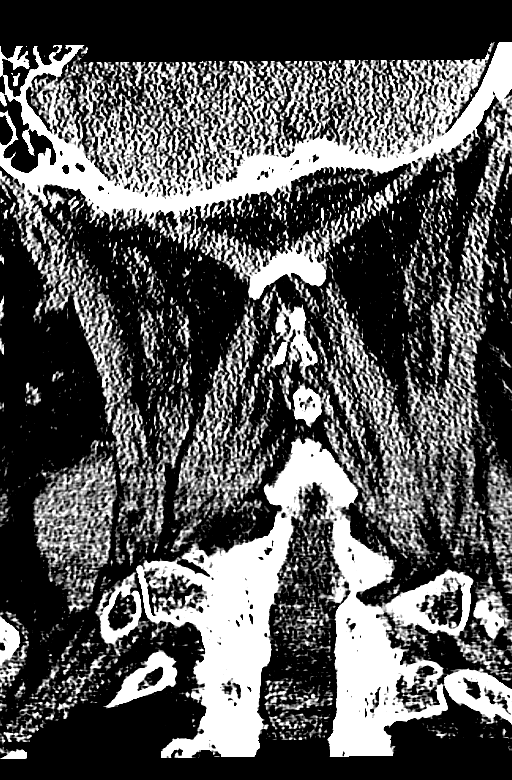

[Series 9: axial bone 2.0 · axial · 0.21mm/px · z∈[+48,+145]mm · 6 of 91 slices shown]
[im 8/91  bone]
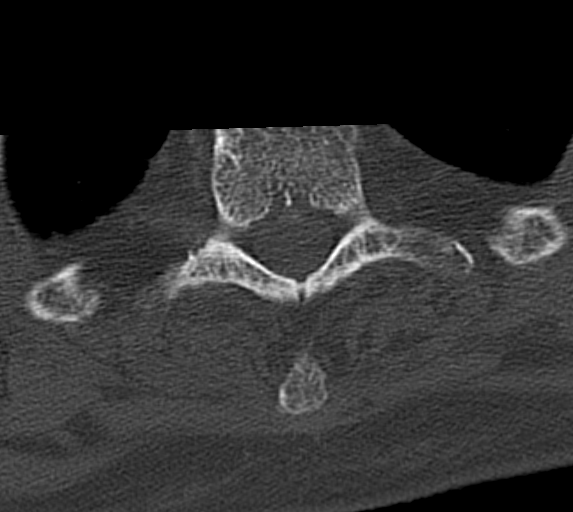
[im 23/91  bone]
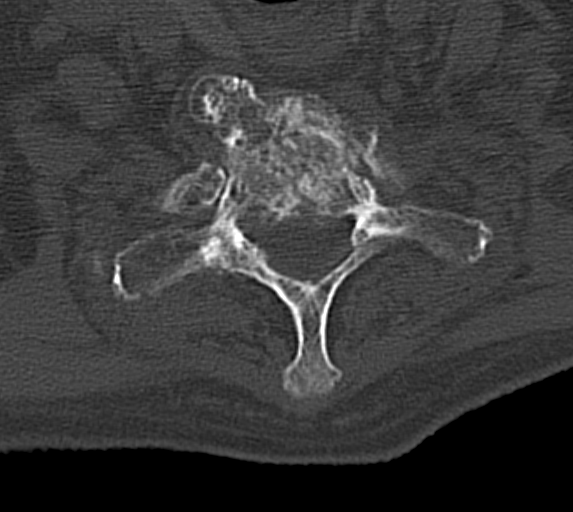
[im 31/91  bone]
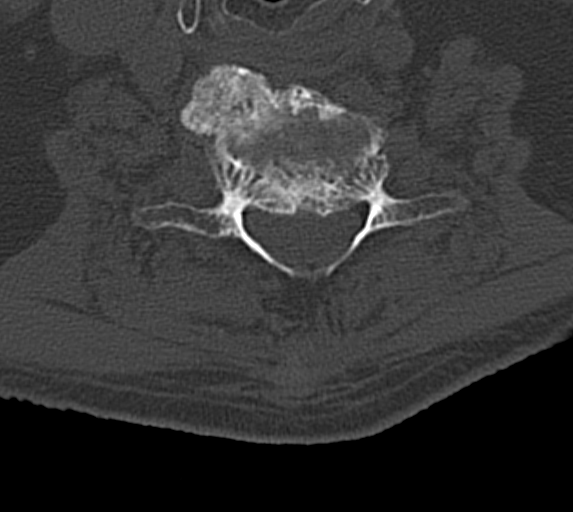
[im 38/91  bone]
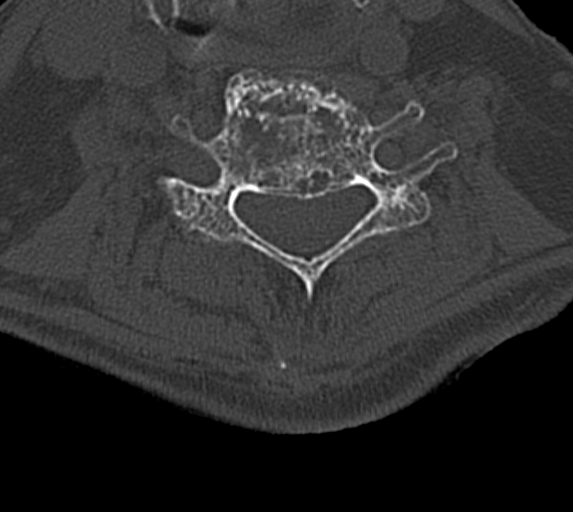
[im 53/91  bone]
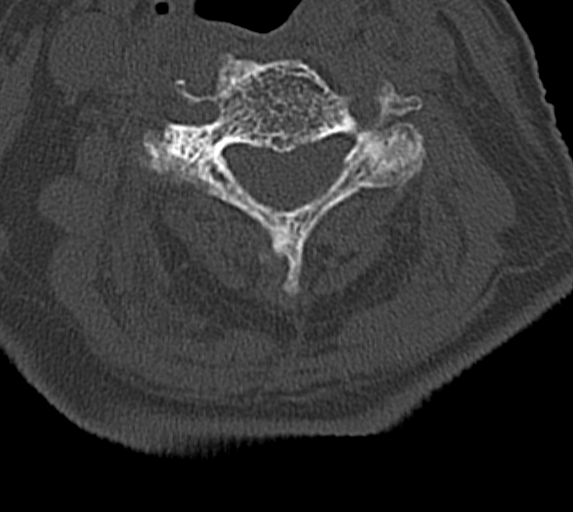
[im 61/91  bone]
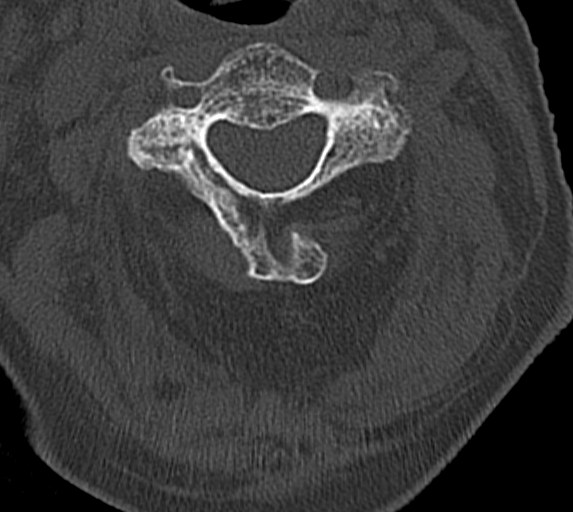

[15 of 47 positions shown; findings below may reference images not displayed]

FINDINGS: CT HEAD FINDINGS

Generalized atrophy.

Normal ventricular morphology.

No midline shift or mass effect.

Small vessel chronic ischemic changes of deep cerebral white matter.

No intracranial hemorrhage, mass lesion, or acute infarction.

Visualized paranasal sinuses and mastoid air cells clear.

Bones demineralized.

Atherosclerotic calcification of internal carotid arteries at
skullbase.

CT CERVICAL SPINE FINDINGS

Diffuse osseous demineralization.

Prevertebral soft tissues normal thickness.

Multilevel facet degenerative changes.

Diffuse disc space narrowing and endplate spur formation cervical
spine.

Vertebral body heights maintained without fracture or subluxation.

Visualized skullbase intact.

Lung apices clear.

Lateral cervical flexion to the RIGHT.
IMPRESSION: Atrophy with small vessel chronic ischemic changes of deep cerebral
white matter.

No acute intracranial abnormalities.

Osseous demineralization with multilevel degenerative disc and facet
disease changes of the cervical spine.

No acute cervical spine abnormalities.
